# Patient Record
Sex: Male | Born: 1995
Health system: Southern US, Community
[De-identification: ages and names within clinical notes are randomized; demographics above are authoritative.]

## PROBLEM LIST (undated history)

## (undated) DIAGNOSIS — K219 Gastro-esophageal reflux disease without esophagitis: Secondary | ICD-10-CM

## (undated) HISTORY — PX: NO PAST SURGERIES: SHX2092

## (undated) HISTORY — DX: Gastro-esophageal reflux disease without esophagitis: K21.9

---

## 2020-05-24 ENCOUNTER — Emergency Department (HOSPITAL_COMMUNITY): Payer: Self-pay

## 2020-05-24 ENCOUNTER — Other Ambulatory Visit: Payer: Self-pay

## 2020-05-24 ENCOUNTER — Emergency Department (HOSPITAL_COMMUNITY)
Admission: EM | Admit: 2020-05-24 | Discharge: 2020-05-24 | Disposition: A | Discharge status: Home / Self Care | Payer: Self-pay | Attending: Emergency Medicine | Admitting: Emergency Medicine

## 2020-05-24 ENCOUNTER — Encounter (HOSPITAL_COMMUNITY): Payer: Self-pay | Admitting: Emergency Medicine

## 2020-05-24 DIAGNOSIS — Z87891 Personal history of nicotine dependence: Secondary | ICD-10-CM | POA: Insufficient documentation

## 2020-05-24 DIAGNOSIS — R0989 Other specified symptoms and signs involving the circulatory and respiratory systems: Secondary | ICD-10-CM

## 2020-05-24 DIAGNOSIS — F458 Other somatoform disorders: Secondary | ICD-10-CM | POA: Insufficient documentation

## 2020-05-24 DIAGNOSIS — R0602 Shortness of breath: Secondary | ICD-10-CM | POA: Insufficient documentation

## 2020-05-24 DIAGNOSIS — R09A2 Foreign body sensation, throat: Secondary | ICD-10-CM

## 2020-05-24 DIAGNOSIS — R0789 Other chest pain: Secondary | ICD-10-CM

## 2020-05-24 DIAGNOSIS — R198 Other specified symptoms and signs involving the digestive system and abdomen: Secondary | ICD-10-CM

## 2020-05-24 MED ORDER — FAMOTIDINE 20 MG PO TABS
40.0000 mg | ORAL_TABLET | Freq: Once | ORAL | Status: AC
  Administered 2020-05-24: 40 mg via ORAL
  Filled 2020-05-24: qty 2

## 2020-05-24 MED ORDER — FAMOTIDINE 20 MG PO TABS: 20.0000 mg | ORAL_TABLET | Freq: Two times a day (BID) | ORAL | 0 refills | Status: DC

## 2020-05-24 MED ORDER — ALUM & MAG HYDROXIDE-SIMETH 200-200-20 MG/5ML PO SUSP
30.0000 mL | Freq: Once | ORAL | Status: AC
  Administered 2020-05-24: 30 mL via ORAL
  Filled 2020-05-24: qty 30

## 2020-05-24 MED ORDER — LIDOCAINE VISCOUS HCL 2 % MT SOLN
15.0000 mL | Freq: Once | OROMUCOSAL | Status: AC
  Administered 2020-05-24: 15 mL via ORAL
  Filled 2020-05-24: qty 15

## 2020-05-24 MED ORDER — OMEPRAZOLE 20 MG PO CPDR: 20.0000 mg | DELAYED_RELEASE_CAPSULE | Freq: Two times a day (BID) | ORAL | 0 refills | Status: DC

## 2020-05-24 MED FILL — FAMOTIDINE 20 MG TABLET: 20 | 15 days supply | Qty: 30 | Fill #0

## 2020-05-24 MED FILL — OMEPRAZOLE 20 MG CAP: 20 | 30 days supply | Qty: 60 | Fill #0

## 2020-05-24 NOTE — ED Provider Notes (Signed)
[facility]-EMERGENCY DEPT Provider Note   CSN: [id number] Arrival date & time: 05/24/20  1055     History Chief Complaint  Patient presents with  . Chest Pain  . Abdominal Pain    Gary Murphy is a 24 y.o. male presents for evaluation of acute onset, progressively worsening dysphagia and globus sensation for 1 year.  He states that symptoms worsened this morning upon awakening so he came to the ED for evaluation.  He tells me that he has been experiencing difficulty swallowing and globus sensation but no pain with swallowing or drooling.  No voice change or fever.  No known sick contacts.  He will experience intermittent midline burning pain that radiates from the epigastric region up to the neck.  No aggravating or alleviating factors noted.  He states that he will feel short of breath "when I smell perfume".  He states that a year ago his PCP started him on omeprazole which he found to be helpful but did not continue taking.  Diet consistent of greasy foods but denies alcohol use or much acidic foods.  Denies abdominal pain, nausea, vomiting.  He is a former smoker, but would only smoke occasionally when he did smoke.  No significant family history of cardiac disease.  The history is provided by the patient.       History reviewed. No pertinent past medical history.  There are no problems to display for this patient.     History reviewed. No pertinent family history.  Social History   Tobacco Use  . Smoking status: Former Games developer  . Smokeless tobacco: Never Used  Substance Use Topics  . Alcohol use: Not on file  . Drug use: Not on file    Home Medications Prior to Admission medications   Medication Sig Start Date End Date Taking? Authorizing Provider  famotidine (PEPCID) 20 MG tablet Take 1 tablet (20 mg total) by mouth 2 (two) times daily. 05/24/20   Luevenia Maxin, Marlow Berenguer A, PA-C  omeprazole (PRILOSEC) 20 MG capsule Take 1 capsule (20 mg total) by mouth 2  (two) times daily before a meal. 05/24/20 06/23/20  Luevenia Maxin, Shamir Tuzzolino A, PA-C    Allergies    Patient has no known allergies.  Review of Systems   Review of Systems  Constitutional: Negative for chills and fever.  HENT: Positive for trouble swallowing. Negative for sore throat and voice change.        +globus sensation  Respiratory: Positive for shortness of breath.   Cardiovascular: Positive for chest pain.  Gastrointestinal: Negative for abdominal pain, nausea and vomiting.  All other systems reviewed and are negative.   Physical Exam Updated Vital Signs BP (!) 154/94 (BP Location: Left Arm)   Pulse 85   Temp 97.8 F (36.6 C) (Oral)   Resp 18   SpO2 100%   Physical Exam Vitals and nursing note reviewed.  Constitutional:      General: He is not in acute distress.    Appearance: He is well-developed.     Comments: Resting comfortably in bed  HENT:     Head: Normocephalic and atraumatic.     Mouth/Throat:     Mouth: Mucous membranes are moist.     Pharynx: No oropharyngeal exudate or posterior oropharyngeal erythema.     Comments: No tonsillar hypertrophy, exudates, erythema, uvular deviation, trismus or sublingual abnormalities.  Tolerating secretions without difficulty.  No abnormal phonation. Eyes:     General:        Right eye:  No discharge.        Left eye: No discharge.     Extraocular Movements: Extraocular movements intact.     Conjunctiva/sclera: Conjunctivae normal.  Neck:     Vascular: No JVD.     Trachea: No tracheal deviation.     Comments: No stridor Cardiovascular:     Rate and Rhythm: Normal rate and regular rhythm.     Pulses: Normal pulses.     Heart sounds: Normal heart sounds.     Comments: 2+ radial and DP/PT pulses bilaterally, Homans sign absent bilaterally, no lower extremity edema, no palpable cords, compartments are soft  Pulmonary:     Effort: Pulmonary effort is normal.  Abdominal:     General: There is no distension.  Musculoskeletal:          General: No swelling or tenderness.     Cervical back: Normal range of motion and neck supple. No rigidity or tenderness.  Skin:    General: Skin is warm and dry.     Findings: No erythema.  Neurological:     Mental Status: He is alert.  Psychiatric:        Behavior: Behavior normal.     ED Results / Procedures / Treatments   Labs (all labs ordered are listed, but only abnormal results are displayed) Labs Reviewed - No data to display  EKG EKG Interpretation  Date/Time:  Saturday May 24 2020 11:41:36 EDT Ventricular Rate:  73 PR Interval:    QRS Duration: 82 QT Interval:  370 QTC Calculation: 408 R Axis:   76 Text Interpretation: Sinus rhythm 12 Lead; Mason-Likar No STEMI Confirmed by Alvester Chou [phone]) on 05/24/2020 11:43:48 AM   Radiology DG Chest 2 View  Result Date: 05/24/2020 CLINICAL DATA:  Chest pain. EXAM: CHEST - 2 VIEW COMPARISON:  None. FINDINGS: The heart size and mediastinal contours are within normal limits. Both lungs are clear. The visualized skeletal structures are unremarkable. IMPRESSION: No active cardiopulmonary disease. Electronically Signed   By: Gerome Sam III M.D   On: 05/24/2020 11:56    Procedures Procedures (including critical care time)  Medications Ordered in ED Medications  alum & mag hydroxide-simeth (MAALOX/MYLANTA) 200-200-20 MG/5ML suspension 30 mL (30 mLs Oral Given 05/24/20 1152)    And  lidocaine (XYLOCAINE) 2 % viscous mouth solution 15 mL (15 mLs Oral Given 05/24/20 1152)  famotidine (PEPCID) tablet 40 mg (40 mg Oral Given 05/24/20 1152)    ED Course  I have reviewed the triage vital signs and the nursing notes.  Pertinent labs & imaging results that were available during my care of the patient were reviewed by me and considered in my medical decision making (see chart for details).    MDM Rules/Calculators/A&P                          Patient presenting for evaluation of globus sensation, intermittent  central chest pains radiating from the epigastric region up.  Symptoms have been present for 1 month but worsened upon awakening this morning's prompted his arrival to the ED.  Reports symptoms are similar to when he was treated for GERD successfully with omeprazole but he did not take this medication with regularity.  Currently does not have insurance or PCP with whom to follow-up.  He is afebrile, initially mildly hypertensive with resolution on subsequent reevaluation's.  Remainder of vital signs are within normal limits.  He is resting comfortably in no distress.  The  pain is not exertional, pleuritic, nor reproducible on palpation.  His EKG shows normal sinus rhythm with no acute ischemic abnormalities.  Chest x-ray shows no acute cardiopulmonary abnormalities including pneumonia, pleural effusion, cardiomegaly, or pneumothorax.  His symptoms entirely resolved with GI cocktail and Pepcid.  Symptoms suggestive of GERD.  He is overall low risk for cardiac disease and I doubt ACS/MI.  Doubt PE, cardiac tamponade, esophageal rupture, or dissection.  We discussed lifestyle and dietary modification and we will start him on a course of Pepcid and omeprazole.  We will follow up with [place] and wellness to salvage primary care services.  Discuss strict ED return precautions.  Patient verbalized understanding of and agreement with plan and is stable for discharge at this time.   Final Clinical Impression(s) / ED Diagnoses Final diagnoses:  Atypical chest pain  Globus sensation    Rx / DC Orders ED Discharge Orders         Ordered    omeprazole (PRILOSEC) 20 MG capsule  2 times daily before meals        05/24/20 1300    famotidine (PEPCID) 20 MG tablet  2 times daily        05/24/20 1300           Jeanie Sewer, PA-C 05/24/20 1347    Terald Sleeper, MD 05/24/20 1413

## 2020-05-24 NOTE — ED Triage Notes (Signed)
Patient states he has chest pain. He describes this pain as starting in the abdomin and radiating up to the chest. He has suffered from acid reflux and says this feels similar. He also complains of difficulty swallowing however, swallowing is not painful.

## 2020-05-24 NOTE — Discharge Instructions (Signed)
Start taking omeprazole and Pepcid twice daily.  Avoid spicy foods, fried foods, fatty foods, alcohol.  Avoid acidic foods.  Avoid eating too close to bedtime, I typically recommend around 2 hours between the time of your last meal and bedtime.  You can also elevate the head of the bed.  Follow-up with [place] and wellness for reevaluation.  Call and tell them you were referred from the emergency department.  Return to the emergency department if any concerning signs or symptoms develop such as fevers, persistent vomiting, difficulty breathing or swallowing, chest pain, shortness of breath or loss of consciousness.

## 2020-07-06 NOTE — Progress Notes (Signed)
07/06/2020 Gary Murphy [id number] 1996-01-04   CHIEF COMPLAINT: Difficulty swallowing  HISTORY OF PRESENT ILLNESS: Gary Murphy is a 24 year old male with a past medical history of GERD symptoms. No surgical history. He presents today accompanied by his cousin. He is originally from [place]. He speaks English well, however, his cousin briefly assists with interpretation during today's office consultation.  He presents today for further evaluation for dysphagia. He complains of having difficulty swallowing since 2019. Food gets stuck to the upper esophagus which lasts for less than 1 minute then passes down the esophagus slowly which initially started in 2019 and has progressively worsened over the past year. His dysphagia symptoms occur daily. Meat is typically problematic but at this point he has dysphagia with most foods. He is able to tolerate softer foods without significant dysphagia. He does not cough, gag or vomit out the stuck food. However, he has intermittent food regurgitation. He has heartburn a few days weekly. He has notice a change in his voice. He has chest and epigastric pain when he gets hungry.  He sleeps with 3 to 4 pillows to keep his head elevated at night time.  He underwent an un-sedated EGD in [place] in 2019. He stated the EGD was normal, no ulcers. He is passing a normal brown formed bowel movement daily. No rectal bleeding or melena. No family history of esophageal, gastric or colon cancer. He denies having a high stress level or anxiety. No fever, sweats or chills. No weight loss.  He presented to [facility] ED on 05/24/2020 with complaints of dysphagia with a globus sensation and chest pain.  He also noted having intermittent epigastric burning pain which radiated up into his neck.  A  chest x-ray and EKG were normal.  He received a GI cocktail and Pepcid and his symptoms resolved.  He was discharged home on Omeprazole 20 mg p.o. twice daily and Famotidine 20 mg  which he took for 20 days no improvement so he stopped taking these medications. No NSAID use.   He received Covid vaccination, 2nd 06/12/2020.   Social History: He smoked cigarettes < 1/2 5 years, stopped smoking 1 year ago.   Family History: Mother age 16. Father age 57 DM. 1 brother and 4 sisters. No family history  Medications: None   No Known Allergies    REVIEW OF SYSTEMS:  Gen: Denies fever, sweats or chills. No weight loss.  CV: See HPI. No palpitations or edema. Resp: Denies cough, shortness of breath of hemoptysis.  GI: See HPI. GU : Denies urinary burning, blood in urine, increased urinary frequency or incontinence. MS: Denies joint pain, muscles aches or weakness. Derm: Denies rash, itchiness, skin lesions or unhealing ulcers. Psych: Denies depression or anxiety. Heme: Denies bruising, bleeding. Neuro:  Denies headaches, dizziness or paresthesias. Endo:  Denies any problems with DM, thyroid or adrenal function.    PHYSICAL EXAM: BP 132/70 (BP Location: Left Arm, Patient Position: Sitting, Cuff Size: Normal)   Pulse 83   Ht 5\' 7"  (1.702 m)   Wt 151 lb 6 oz (68.7 kg)   SpO2 100%   BMI 23.71 kg/m   General: Well developed 24 year old male in no acute distress. Head: Normocephalic and atraumatic. Eyes:  Sclerae non-icteric, conjunctive pink. Ears: Normal auditory acuity. Mouth: Dentition intact. No ulcers or lesions.  Neck: Supple, no lymphadenopathy or thyromegaly.  Lungs: Clear bilaterally to auscultation without wheezes, crackles or rhonchi. Heart: Regular rate and rhythm. No murmur, rub or gallop  appreciated.  Abdomen: Soft, flat abdomen, non distended. Nontender. No masses. No hepatosplenomegaly. Normoactive bowel sounds x 4 quadrants.  Rectal: Deferred. Musculoskeletal: Symmetrical with no gross deformities. Skin: Warm and dry. No rash or lesions on visible extremities. Extremities: No edema. Neurological: Alert oriented x 4, no focal deficits.    Psychological:  Alert and cooperative. Normal mood and affect.  ASSESSMENT AND PLAN:  45.  24 year old male with GERD, dysphagia, chest pain and epigastric pain. Normal EKG and chest x-ray 05/2020. Patient reported having normal EGD in [place] in 2019. -GERD handout -Pantoprazole 20 mg 1 p.o. daily -EGD benefits and risks discussed including risk with sedation, risk of bleeding, perforation and infection  -CBC and CMP -Further follow-up to be determined after the above evaluation complete -Patient to call our office if his symptoms worsen -Discussed scheduling a barium swallow if the above evaluation negative      CC:  No ref. provider found

## 2020-07-07 ENCOUNTER — Encounter: Payer: Self-pay | Admitting: Nurse Practitioner

## 2020-07-07 ENCOUNTER — Telehealth: Payer: Self-pay | Admitting: General Surgery

## 2020-07-07 ENCOUNTER — Other Ambulatory Visit (INDEPENDENT_AMBULATORY_CARE_PROVIDER_SITE_OTHER): Payer: Self-pay

## 2020-07-07 ENCOUNTER — Ambulatory Visit (INDEPENDENT_AMBULATORY_CARE_PROVIDER_SITE_OTHER): Payer: Self-pay | Admitting: Nurse Practitioner

## 2020-07-07 VITALS — BP 132/70 | HR 83 | Ht 67.0 in | Wt 151.4 lb

## 2020-07-07 DIAGNOSIS — R1310 Dysphagia, unspecified: Secondary | ICD-10-CM

## 2020-07-07 DIAGNOSIS — K219 Gastro-esophageal reflux disease without esophagitis: Secondary | ICD-10-CM

## 2020-07-07 DIAGNOSIS — R1013 Epigastric pain: Secondary | ICD-10-CM

## 2020-07-07 LAB — CBC WITH DIFFERENTIAL/PLATELET
Basophils Absolute: 0 10*3/uL (ref 0.0–0.1)
Basophils Relative: 0.6 % (ref 0.0–3.0)
Eosinophils Absolute: 0 10*3/uL (ref 0.0–0.7)
Eosinophils Relative: 0.7 % (ref 0.0–5.0)
HCT: 41.6 % (ref 39.0–52.0)
Hemoglobin: 14 g/dL (ref 13.0–17.0)
Lymphocytes Relative: 36.3 % (ref 12.0–46.0)
Lymphs Abs: 2.1 10*3/uL (ref 0.7–4.0)
MCHC: 33.8 g/dL (ref 30.0–36.0)
MCV: 86.5 fl (ref 78.0–100.0)
Monocytes Absolute: 0.3 10*3/uL (ref 0.1–1.0)
Monocytes Relative: 5 % (ref 3.0–12.0)
Neutro Abs: 3.3 10*3/uL (ref 1.4–7.7)
Neutrophils Relative %: 57.4 % (ref 43.0–77.0)
Platelets: 153 10*3/uL (ref 150.0–400.0)
RBC: 4.81 Mil/uL (ref 4.22–5.81)
RDW: 12.9 % (ref 11.5–15.5)
WBC: 5.7 10*3/uL (ref 4.0–10.5)

## 2020-07-07 LAB — COMPREHENSIVE METABOLIC PANEL
ALT: 11 U/L (ref 0–53)
AST: 12 U/L (ref 0–37)
Albumin: 4.8 g/dL (ref 3.5–5.2)
Alkaline Phosphatase: 66 U/L (ref 39–117)
BUN: 13 mg/dL (ref 6–23)
CO2: 28 mEq/L (ref 19–32)
Calcium: 9.6 mg/dL (ref 8.4–10.5)
Chloride: 102 mEq/L (ref 96–112)
Creatinine, Ser: 1.07 mg/dL (ref 0.40–1.50)
GFR: 102.44 mL/min (ref >60.00)
Glucose, Bld: 94 mg/dL (ref 70–99)
Potassium: 3.9 mEq/L (ref 3.5–5.1)
Sodium: 139 mEq/L (ref 135–145)
Total Bilirubin: 0.9 mg/dL (ref 0.2–1.2)
Total Protein: 7.4 g/dL (ref 6.0–8.3)

## 2020-07-07 MED ORDER — PANTOPRAZOLE SODIUM 20 MG PO TBEC: 20.0000 mg | DELAYED_RELEASE_TABLET | Freq: Every day | ORAL | 1 refills | Status: DC

## 2020-07-07 NOTE — Telephone Encounter (Signed)
-----   Message from Arnaldo Natal, NP sent at 07/07/2020 12:19 PM EDT ----- French Ana, pls inform patient his labs were normal. Thx

## 2020-07-07 NOTE — Patient Instructions (Addendum)
If you are age 24 or older, your body mass index should be between 23-30. Your Body mass index is 23.71 kg/m. If this is out of the aforementioned range listed, please consider follow up with your Primary Care Provider.  If you are age 89 or younger, your body mass index should be between 19-25. Your Body mass index is 23.71 kg/m. If this is out of the aformentioned range listed, please consider follow up with your Primary Care Provider.   We have sent the following medications to your pharmacy for you to pick up at your convenience:  Pantoprazole 20mg  1 tablet daily  1. We have enclosed a pamphlet about GERD 2. Please cut your food into small pieces, chew food thoroughly. 3.call the office if your symptoms worsen.  Your provider has requested that you go to the basement level for lab work before leaving today. Press "B" on the elevator. The lab is located at the first door on the left as you exit the elevator.  Due to recent changes in healthcare laws, you may see the results of your imaging and laboratory studies on MyChart before your provider has had a chance to review them.  We understand that in some cases there may be results that are confusing or concerning to you. Not all laboratory results come back in the same time frame and the provider may be waiting for multiple results in order to interpret others.  Please give 48 hours in order for your provider to thoroughly review all the results before contacting the office for clarification of your results.   Thank you for choosing [facility], CRNP  [phone]

## 2020-07-14 NOTE — Progress Notes (Signed)
Reviewed and agree with documentation and assessment and plan. K. Veena Maggi Hershkowitz , MD   

## 2020-07-15 NOTE — Telephone Encounter (Signed)
No answer, cannot leave voicemail

## 2020-07-21 ENCOUNTER — Encounter: Payer: Self-pay | Admitting: General Surgery

## 2020-07-23 ENCOUNTER — Ambulatory Visit (AMBULATORY_SURGERY_CENTER): Payer: 59 | Admitting: Gastroenterology

## 2020-07-23 ENCOUNTER — Encounter: Payer: Self-pay | Admitting: Gastroenterology

## 2020-07-23 ENCOUNTER — Other Ambulatory Visit: Payer: Self-pay

## 2020-07-23 VITALS — BP 109/55 | HR 68 | Resp 19 | Ht 67.0 in | Wt 151.0 lb

## 2020-07-23 DIAGNOSIS — K2970 Gastritis, unspecified, without bleeding: Secondary | ICD-10-CM | POA: Diagnosis not present

## 2020-07-23 DIAGNOSIS — R1310 Dysphagia, unspecified: Secondary | ICD-10-CM | POA: Diagnosis not present

## 2020-07-23 DIAGNOSIS — B9681 Helicobacter pylori [H. pylori] as the cause of diseases classified elsewhere: Secondary | ICD-10-CM

## 2020-07-23 DIAGNOSIS — K3189 Other diseases of stomach and duodenum: Secondary | ICD-10-CM

## 2020-07-23 DIAGNOSIS — K219 Gastro-esophageal reflux disease without esophagitis: Secondary | ICD-10-CM

## 2020-07-23 DIAGNOSIS — K2100 Gastro-esophageal reflux disease with esophagitis, without bleeding: Secondary | ICD-10-CM | POA: Diagnosis not present

## 2020-07-23 DIAGNOSIS — K2990 Gastroduodenitis, unspecified, without bleeding: Secondary | ICD-10-CM

## 2020-07-23 HISTORY — PX: UPPER GASTROINTESTINAL ENDOSCOPY: SHX188

## 2020-07-23 MED ORDER — OMEPRAZOLE 40 MG PO CPDR: 40.0000 mg | DELAYED_RELEASE_CAPSULE | Freq: Every day | ORAL | 2 refills | Status: DC

## 2020-07-23 MED ORDER — SODIUM CHLORIDE 0.9 % IV SOLN: 500.0000 mL | Freq: Once | INTRAVENOUS | Status: DC

## 2020-07-23 NOTE — Progress Notes (Signed)
Called to room to assist during endoscopic procedure.  Patient ID and intended procedure confirmed with present staff. Received instructions for my participation in the procedure from the performing physician.  

## 2020-07-23 NOTE — Op Note (Signed)
[facility] Patient Name: Gary Murphy Procedure Date: 07/23/2020 7:47 AM MRN: [id number] Endoscopist: Napoleon FormKavitha V. Donnavin Vandenbrink , MD Age: 24 Referring MD:  Date of Birth: 02/04/1996 Gender: Male Account #: [id number] Procedure:                Upper GI endoscopy Indications:              Dysphagia Medicines:                Monitored Anesthesia Care Procedure:                Pre-Anesthesia Assessment:                           - Prior to the procedure, a History and Physical                            was performed, and patient medications and                            allergies were reviewed. The patient's tolerance of                            previous anesthesia was also reviewed. The risks                            and benefits of the procedure and the sedation                            options and risks were discussed with the patient.                            All questions were answered, and informed consent                            was obtained. Prior Anticoagulants: The patient has                            taken no previous anticoagulant or antiplatelet                            agents. ASA Grade Assessment: II - A patient with                            mild systemic disease. After reviewing the risks                            and benefits, the patient was deemed in                            satisfactory condition to undergo the procedure.                           After obtaining informed consent, the endoscope was  passed under direct vision. Throughout the                            procedure, the patient's blood pressure, pulse, and                            oxygen saturations were monitored continuously. The                            Endoscope was introduced through the mouth, and                            advanced to the second part of duodenum. The upper                            GI endoscopy was accomplished without  difficulty.                            The patient tolerated the procedure well. Scope In: Scope Out: Findings:                 The Z-line was regular and was found 40 cm from the                            incisors.                           LA Grade B (one or more mucosal breaks greater than                            5 mm, not extending between the tops of two mucosal                            folds) esophagitis was found 38 to 40 cm from the                            incisors. Biopsies were obtained from the proximal                            and distal esophagus with cold forceps for                            histology of suspected eosinophilic esophagitis.                           No endoscopic abnormality was evident in the                            esophagus to explain the patient's complaint of                            dysphagia. It was decided, however, to proceed with  dilation of the lower third of the esophagus. A TTS                            dilator was passed through the scope. Dilation with                            an 18-19-20 mm balloon dilator was performed to 20                            mm. The dilation site was examined following                            endoscope reinsertion and showed no change.                           The gastroesophageal flap valve was visualized                            endoscopically and classified as Hill Grade II                            (fold present, opens with respiration).                           Patchy mildly erythematous mucosa was found in the                            gastric antrum and in the prepyloric region of the                            stomach. Biopsies were taken with a cold forceps                            for Helicobacter pylori testing.                           The cardia and gastric fundus were normal on                            retroflexion.                            The examined duodenum was normal. Complications:            No immediate complications. Estimated Blood Loss:     Estimated blood loss was minimal. Impression:               - Z-line regular, 40 cm from the incisors.                           - LA Grade B reflux esophagitis. Biopsied.                           - No endoscopic esophageal abnormality to explain  patient's dysphagia. Esophagus dilated. Dilated.                           - Gastroesophageal flap valve classified as Hill                            Grade II (fold present, opens with respiration).                           - Erythematous mucosa in the antrum and prepyloric                            region of the stomach. Biopsied.                           - Normal examined duodenum. Recommendation:           - Patient has a contact number available for                            emergencies. The signs and symptoms of potential                            delayed complications were discussed with the                            patient. Return to normal activities tomorrow.                            Written discharge instructions were provided to the                            patient.                           - Resume previous diet.                           - Continue present medications.                           - Await pathology results.                           - Follow an antireflux regimen indefinitely.                           - Use Prilosec (omeprazole) 40 mg PO daily for 3                            months. Napoleon Form, MD 07/23/2020 9:01:49 AM This report has been signed electronically.

## 2020-07-23 NOTE — Progress Notes (Signed)
815 Robinul 0.1 mg IV given due large amount of secretions upon assessment.  MD made aware, vss

## 2020-07-23 NOTE — Progress Notes (Signed)
Report given to PACU, vss 

## 2020-07-23 NOTE — Progress Notes (Signed)
Pt's states no medical or surgical changes since previsit or office visit.   Vitals EW 

## 2020-07-23 NOTE — Patient Instructions (Signed)
Handouts provided on gastritis, esophagitis and stricture and GERD.   Start Prilosec (omeprazole) 40mg  by mouth daily for 3 months. Stop taking the Pantoprazole. Call my office for a follow up appointment if you are still having difficulties after taking the new medication for 3 months.   Follow antireflux regimen indefinitely- see GERD handout.    YOU HAD AN ENDOSCOPIC PROCEDURE TODAY AT THE [facility]:   Refer to the procedure report that was given to you for any specific questions about what was found during the examination.  If the procedure report does not answer your questions, please call your gastroenterologist to clarify.  If you requested that your care partner not be given the details of your procedure findings, then the procedure report has been included in a sealed envelope for you to review at your convenience later.  YOU SHOULD EXPECT: Some feelings of bloating in the abdomen. Passage of more gas than usual.  Walking can help get rid of the air that was put into your GI tract during the procedure and reduce the bloating. If you had a lower endoscopy (such as a colonoscopy or flexible sigmoidoscopy) you may notice spotting of blood in your stool or on the toilet paper. If you underwent a bowel prep for your procedure, you may not have a normal bowel movement for a few days.  Please Note:  You might notice some irritation and congestion in your nose or some drainage.  This is from the oxygen used during your procedure.  There is no need for concern and it should clear up in a day or so.  SYMPTOMS TO REPORT IMMEDIATELY:   Following upper endoscopy (EGD)  Vomiting of blood or coffee ground material  New chest pain or pain under the shoulder blades  Painful or persistently difficult swallowing  New shortness of breath  Fever of 100F or higher  Black, tarry-looking stools  For urgent or emergent issues, a gastroenterologist can be reached at any hour by calling [phone]  [phone]. Do not use MyChart messaging for urgent concerns.    DIET:  We do recommend a small meal at first, but then you may proceed to your regular diet.  Drink plenty of fluids but you should avoid alcoholic beverages for 24 hours.  ACTIVITY:  You should plan to take it easy for the rest of today and you should NOT DRIVE or use heavy machinery until tomorrow (because of the sedation medicines used during the test).    FOLLOW UP: Our staff will call the number listed on your records 48-72 hours following your procedure to check on you and address any questions or concerns that you may have regarding the information given to you following your procedure. If we do not reach you, we will leave a message.  We will attempt to reach you two times.  During this call, we will ask if you have developed any symptoms of COVID 19. If you develop any symptoms (ie: fever, flu-like symptoms, shortness of breath, cough etc.) before then, please call [phone].  If you test positive for Covid 19 in the 2 weeks post procedure, please call and report this information to [phone].    If any biopsies were taken you will be contacted by phone or by letter within the next 1-3 weeks.  Please call us at [phone] if you have not heard about the biopsies in 3 weeks.    SIGNATURES/CONFIDENTIALITY: You and/or your care partner have signed paperwork which will be  entered into your electronic medical record.  These signatures attest to the fact that that the information above on your After Visit Summary has been reviewed and is understood.  Full responsibility of the confidentiality of this discharge information lies with you and/or your care-partner.

## 2020-07-25 ENCOUNTER — Telehealth: Payer: Self-pay

## 2020-07-25 ENCOUNTER — Telehealth: Payer: Self-pay | Admitting: *Deleted

## 2020-07-25 NOTE — Telephone Encounter (Signed)
First attempt, left VM.  

## 2020-07-25 NOTE — Telephone Encounter (Signed)
Left message on answering machine. 

## 2020-08-12 ENCOUNTER — Encounter: Payer: Self-pay | Admitting: Gastroenterology

## 2020-08-12 ENCOUNTER — Telehealth: Payer: Self-pay | Admitting: Gastroenterology

## 2020-08-12 ENCOUNTER — Other Ambulatory Visit: Payer: Self-pay

## 2020-08-12 ENCOUNTER — Telehealth: Payer: Self-pay

## 2020-08-12 DIAGNOSIS — K2970 Gastritis, unspecified, without bleeding: Secondary | ICD-10-CM

## 2020-08-12 DIAGNOSIS — K219 Gastro-esophageal reflux disease without esophagitis: Secondary | ICD-10-CM

## 2020-08-12 NOTE — Telephone Encounter (Signed)
Positive H pylori Needs Pylera PA started through [employer] [place]: [id number] - PA Case ID: [id number]

## 2020-08-12 NOTE — Telephone Encounter (Signed)
Advised of his positive H Pylori test.

## 2020-08-13 NOTE — Telephone Encounter (Signed)
Explained to the patient. 

## 2020-08-13 NOTE — Telephone Encounter (Signed)
He doesn't have eosinophilic esophagitis but has lymphocytic esophagitis, it is unclear what triggers lymphocytic esophagitis, uncontrolled GERD is one of the triggers, hence plan to treat with high dose PPI twice daily and follow up in office. Thanks

## 2020-08-13 NOTE — Telephone Encounter (Signed)
Called the patient back. Line rang several times then turned to a busy signal. No voicemail.

## 2020-08-13 NOTE — Telephone Encounter (Signed)
Patient called has additional questions in reference to the EGD results

## 2020-08-13 NOTE — Telephone Encounter (Signed)
Spoke with the patient. He wants a copy of the pathology report mailed to him. Address confirmed. He asks about eosinophilic esophagitis. He was expecting that he does have this. Please advise. Thanks

## 2020-08-14 ENCOUNTER — Other Ambulatory Visit: Payer: Self-pay

## 2020-08-14 DIAGNOSIS — K219 Gastro-esophageal reflux disease without esophagitis: Secondary | ICD-10-CM

## 2020-08-14 DIAGNOSIS — K2970 Gastritis, unspecified, without bleeding: Secondary | ICD-10-CM

## 2020-08-14 MED ORDER — PYLERA 140-125-125 MG PO CAPS: 3.0000 | ORAL_CAPSULE | Freq: Three times a day (TID) | ORAL | 0 refills | Status: DC

## 2020-08-14 MED ORDER — OMEPRAZOLE 40 MG PO CPDR: 40.0000 mg | DELAYED_RELEASE_CAPSULE | Freq: Two times a day (BID) | ORAL | 2 refills | Status: DC

## 2020-08-15 ENCOUNTER — Other Ambulatory Visit: Payer: Self-pay

## 2020-08-15 MED ORDER — TALICIA 250-12.5-10 MG PO CPDR: 4.0000 | DELAYED_RELEASE_CAPSULE | Freq: Three times a day (TID) | ORAL | 0 refills | Status: AC

## 2020-08-15 MED ORDER — TALICIA 250-12.5-10 MG PO CPDR: 4.0000 | DELAYED_RELEASE_CAPSULE | Freq: Three times a day (TID) | ORAL | 0 refills | Status: DC

## 2020-08-15 NOTE — Telephone Encounter (Signed)
Insurance has denied payment for Pylera. Kathaleen Bury Rx submitted to [facility]. Medical records faxed for the pharmacy team for prior authorization. I have left the patient a message to explain this.

## 2020-08-18 ENCOUNTER — Telehealth: Payer: Self-pay | Admitting: Gastroenterology

## 2020-08-18 NOTE — Telephone Encounter (Signed)
Called the pharmacy and cancelled the prescription.

## 2020-08-18 NOTE — Telephone Encounter (Signed)
Will [facility] give me an update on this medication? Thanks

## 2020-08-18 NOTE — Telephone Encounter (Signed)
They should - I will watch for something.  Also that is who is doing our lunch today so we can ask him to check on it.

## 2020-08-20 ENCOUNTER — Other Ambulatory Visit: Payer: Self-pay

## 2020-08-20 DIAGNOSIS — K2970 Gastritis, unspecified, without bleeding: Secondary | ICD-10-CM

## 2020-08-20 DIAGNOSIS — K219 Gastro-esophageal reflux disease without esophagitis: Secondary | ICD-10-CM

## 2020-08-20 MED ORDER — OMEPRAZOLE 40 MG PO CPDR: 40.0000 mg | DELAYED_RELEASE_CAPSULE | Freq: Two times a day (BID) | ORAL | 0 refills | Status: DC

## 2020-08-20 NOTE — Telephone Encounter (Signed)
Pt is requesting a call back from a nurse to discuss the medication omeprazole, pt would like to know if he should continue taking this medication after the 2 weeks he gets done with the TALICIA.

## 2020-08-20 NOTE — Telephone Encounter (Signed)
Still pending.  I will check with the rep

## 2020-08-20 NOTE — Telephone Encounter (Signed)
Is there anything yet?

## 2020-08-20 NOTE — Telephone Encounter (Signed)
Dr Lavon Paganini The patient had already picked up the Omeprazole when I called in the Pylera. Insurance denied it. We got him Talicia for $35 but it has the Omeprazole in it. Do you want him to continue to take Omeprazole after the Talicia, following the usual protocol for follow up testing?

## 2020-08-20 NOTE — Telephone Encounter (Signed)
Yes it is fine to use Talicia, please advise him to take omeprazole 40 mg twice daily along with that as the medication pack has very low-dose omeprazole.  We will check H. pylori stool antigen 2 to 3 weeks weeks after completing therapy off PPI.  Thank you

## 2020-08-22 NOTE — Telephone Encounter (Signed)
Gary Murphy approved for $35.  Company is reaching out to patient so it can be shipped

## 2020-10-10 ENCOUNTER — Encounter: Payer: Self-pay | Admitting: Gastroenterology

## 2020-10-10 ENCOUNTER — Ambulatory Visit (INDEPENDENT_AMBULATORY_CARE_PROVIDER_SITE_OTHER): Payer: 59 | Admitting: Gastroenterology

## 2020-10-10 VITALS — BP 128/66 | HR 72 | Ht 70.0 in | Wt 159.0 lb

## 2020-10-10 DIAGNOSIS — K219 Gastro-esophageal reflux disease without esophagitis: Secondary | ICD-10-CM

## 2020-10-10 DIAGNOSIS — K2970 Gastritis, unspecified, without bleeding: Secondary | ICD-10-CM | POA: Diagnosis not present

## 2020-10-10 DIAGNOSIS — B9681 Helicobacter pylori [H. pylori] as the cause of diseases classified elsewhere: Secondary | ICD-10-CM

## 2020-10-10 DIAGNOSIS — R1310 Dysphagia, unspecified: Secondary | ICD-10-CM | POA: Diagnosis not present

## 2020-10-10 DIAGNOSIS — K52839 Microscopic colitis, unspecified: Secondary | ICD-10-CM

## 2020-10-10 MED ORDER — OMEPRAZOLE 40 MG PO CPDR: 40.0000 mg | DELAYED_RELEASE_CAPSULE | Freq: Two times a day (BID) | ORAL | 3 refills | Status: AC

## 2020-10-10 NOTE — Progress Notes (Signed)
Gary Murphy    [id number]    [birth year]  Primary Care Physician:Patient, No Pcp Per  Referring Physician: No referring provider defined for this encounter.   Chief complaint:  H.pylori  HPI:  25 year old very pleasant male is here for follow-up visit for H. pylori gastritis, dyspepsia and dysphagia  He completed treatment for H. Pylori, 2-3 weeks ago, is feeling better but still has dyspepsia symptoms  He continues to have dysphagia especially with meat though has noticed significant improvement with PPI  EGD July 23, 2020: Regular Z-line.  LA grade B reflux esophagitis, biopsies showed increased intraepithelial lymphocytes.  S/p esophageal dilation with TTS balloon to 20 mm.  Gastritis, biopsies positive for H. pylori infection  Outpatient Encounter Medications as of 10/10/2020  Medication Sig   omeprazole (PRILOSEC) 40 MG capsule Take 1 capsule (40 mg total) by mouth 2 (two) times daily before a meal for 14 days. (Patient taking differently: Take 40 mg by mouth in the morning and at bedtime.)   No facility-administered encounter medications on file as of 10/10/2020.    Allergies as of 10/10/2020   (No Known Allergies)    Past Medical History:  Diagnosis Date   GERD (gastroesophageal reflux disease)     Past Surgical History:  Procedure Laterality Date   NO PAST SURGERIES     UPPER GASTROINTESTINAL ENDOSCOPY  07/23/2020    Family History  Problem Relation Age of Onset   Diabetes Father    Colon cancer Neg Hx    Esophageal cancer Neg Hx    Pancreatic cancer Neg Hx    Stomach cancer Neg Hx     Social History   Socioeconomic History   Marital status: Single    Spouse name: Not on file   Number of children: Not on file   Years of education: Not on file   Highest education level: Not on file  Occupational History   Not on file  Tobacco Use   Smoking status: Former Smoker    Types: Cigarettes   Smokeless tobacco: Never Used   Building services engineer Use: Never used  Substance and Sexual Activity   Alcohol use: Never   Drug use: Never   Sexual activity: Not on file  Other Topics Concern   Not on file  Social History Narrative   Not on file   Social Determinants of Health   Financial Resource Strain: Not on file  Food Insecurity: Not on file  Transportation Needs: Not on file  Physical Activity: Not on file  Stress: Not on file  Social Connections: Not on file  Intimate Partner Violence: Not on file      Review of systems: All other review of systems negative except as mentioned in the HPI.   Physical Exam: Vitals:   10/10/20 1316  BP: 128/66  Pulse: 72   Body mass index is 22.81 kg/m. Gen:      No acute distress HEENT:  sclera anicteric Abd:      soft, non-tender; no palpable masses, no distension Ext:    No edema Neuro: alert and oriented x 3 Psych: normal mood and affect  Data Reviewed:  Reviewed labs, radiology imaging, old records and pertinent past GI work up   Assessment and Plan/Recommendations:  25 year old very pleasant male with history of lymphocytic esophagitis and H. pylori gastritis with complaints of persistent solid dysphagia and dyspepsia  Plan to proceed with EGD with repeat esophageal biopsies  and esophageal dilation if needed We will also obtain gastric biopsies to confirm eradication of H. Pylori Continue PPI and antireflux measures  The risks and benefits as well as alternatives of endoscopic procedure(s) have been discussed and reviewed. All questions answered. The patient agrees to proceed.   The patient was provided an opportunity to ask questions and all were answered. The patient agreed with the plan and demonstrated an understanding of the instructions.  Iona Beard , MD    CC: No ref. provider found

## 2020-10-10 NOTE — Patient Instructions (Signed)
You have been scheduled for an endoscopy. Please follow written instructions given to you at your visit today. If you use inhalers (even only as needed), please bring them with you on the day of your procedure.   Due to recent changes in healthcare laws, you may see the results of your imaging and laboratory studies on MyChart before your provider has had a chance to review them.  We understand that in some cases there may be results that are confusing or concerning to you. Not all laboratory results come back in the same time frame and the provider may be waiting for multiple results in order to interpret others.  Please give [place] 48 hours in order for your provider to thoroughly review all the results before contacting the office for clarification of your results.   We have sent Omeprazole to your pharmacy  Follow up in 3 months  I appreciate the  opportunity to care for you  Thank You   Marsa Aris , MD

## 2020-10-24 ENCOUNTER — Encounter: Payer: Self-pay | Admitting: Gastroenterology

## 2020-11-26 ENCOUNTER — Telehealth: Payer: Self-pay | Admitting: Gastroenterology

## 2020-11-26 NOTE — Telephone Encounter (Signed)
Pt is requesting a call back from a nurse to discuss some questions he has about his procedure scheduled for tomorrow.

## 2020-11-26 NOTE — Telephone Encounter (Signed)
Attempted to call the patient back. Reached his brother and he will give him our direct number to call back with questions.

## 2020-11-27 ENCOUNTER — Encounter: Payer: Self-pay | Admitting: Gastroenterology

## 2020-11-27 ENCOUNTER — Other Ambulatory Visit: Payer: Self-pay

## 2020-11-27 ENCOUNTER — Ambulatory Visit (AMBULATORY_SURGERY_CENTER): Payer: 59 | Admitting: Gastroenterology

## 2020-11-27 VITALS — BP 92/42 | HR 70 | Temp 98.4°F | Resp 18 | Ht 70.0 in | Wt 159.0 lb

## 2020-11-27 DIAGNOSIS — R1319 Other dysphagia: Secondary | ICD-10-CM | POA: Diagnosis present

## 2020-11-27 DIAGNOSIS — K2970 Gastritis, unspecified, without bleeding: Secondary | ICD-10-CM | POA: Diagnosis not present

## 2020-11-27 DIAGNOSIS — K219 Gastro-esophageal reflux disease without esophagitis: Secondary | ICD-10-CM

## 2020-11-27 DIAGNOSIS — B9681 Helicobacter pylori [H. pylori] as the cause of diseases classified elsewhere: Secondary | ICD-10-CM

## 2020-11-27 MED ORDER — SODIUM CHLORIDE 0.9 % IV SOLN: 500.0000 mL | Freq: Once | INTRAVENOUS | Status: DC

## 2020-11-27 NOTE — Patient Instructions (Signed)
YOU HAD AN ENDOSCOPIC PROCEDURE TODAY AT THE [facility]:   Refer to the procedure report that was given to you for any specific questions about what was found during the examination.  If the procedure report does not answer your questions, please call your gastroenterologist to clarify.  If you requested that your care partner not be given the details of your procedure findings, then the procedure report has been included in a sealed envelope for you to review at your convenience later.  YOU SHOULD EXPECT: Some feelings of bloating in the abdomen. Passage of more gas than usual.  Walking can help get rid of the air that was put into your GI tract during the procedure and reduce the bloating. If you had a lower endoscopy (such as a colonoscopy or flexible sigmoidoscopy) you may notice spotting of blood in your stool or on the toilet paper. If you underwent a bowel prep for your procedure, you may not have a normal bowel movement for a few days.  Please Note:  You might notice some irritation and congestion in your nose or some drainage.  This is from the oxygen used during your procedure.  There is no need for concern and it should clear up in a day or so.  SYMPTOMS TO REPORT IMMEDIATELY:    Following upper endoscopy (EGD)  Vomiting of blood or coffee ground material  New chest pain or pain under the shoulder blades  Painful or persistently difficult swallowing  New shortness of breath  Fever of 100F or higher  Black, tarry-looking stools  For urgent or emergent issues, a gastroenterologist can be reached at any hour by calling [phone]. Do not use MyChart messaging for urgent concerns.    DIET:  We do recommend a small meal at first, but then you may proceed to your regular diet.  Drink plenty of fluids but you should avoid alcoholic beverages for 24 hours.  ACTIVITY:  You should plan to take it easy for the rest of today and you should NOT DRIVE or use heavy machinery  until tomorrow (because of the sedation medicines used during the test).    FOLLOW UP: Our staff will call the number listed on your records 48-72 hours following your procedure to check on you and address any questions or concerns that you may have regarding the information given to you following your procedure. If we do not reach you, we will leave a message.  We will attempt to reach you two times.  During this call, we will ask if you have developed any symptoms of COVID 19. If you develop any symptoms (ie: fever, flu-like symptoms, shortness of breath, cough etc.) before then, please call [phone].  If you test positive for Covid 19 in the 2 weeks post procedure, please call and report this information to us.    If any biopsies were taken you will be contacted by phone or by letter within the next 1-3 weeks.  Please call us at [phone] if you have not heard about the biopsies in 3 weeks.    SIGNATURES/CONFIDENTIALITY: You and/or your care partner have signed paperwork which will be entered into your electronic medical record.  These signatures attest to the fact that that the information above on your After Visit Summary has been reviewed and is understood.  Full responsibility of the confidentiality of this discharge information lies with you and/or your care-partner. 

## 2020-11-27 NOTE — Op Note (Signed)
[facility] Patient Name: Gary Murphy Procedure Date: 11/27/2020 10:27 AM MRN: [id number] Endoscopist: Napoleon Form , MD Age: 25 Referring MD:  Date of Birth: 11/17/1995 Gender: Male Account #: [id number] Procedure:                Upper GI endoscopy Indications:              Dysphagia, Esophageal reflux symptoms that persist                            despite appropriate therapy, lymphocytic                            Esophagitis and H.pylori gastritis Medicines:                Monitored Anesthesia Care Procedure:                Pre-Anesthesia Assessment:                           - Prior to the procedure, a History and Physical                            was performed, and patient medications and                            allergies were reviewed. The patient's tolerance of                            previous anesthesia was also reviewed. The risks                            and benefits of the procedure and the sedation                            options and risks were discussed with the patient.                            All questions were answered, and informed consent                            was obtained. Prior Anticoagulants: The patient has                            taken no previous anticoagulant or antiplatelet                            agents. ASA Grade Assessment: II - A patient with                            mild systemic disease. After reviewing the risks                            and benefits, the patient was deemed in  satisfactory condition to undergo the procedure.                           After obtaining informed consent, the endoscope was                            passed under direct vision. Throughout the                            procedure, the patient's blood pressure, pulse, and                            oxygen saturations were monitored continuously. The                            Endoscope was introduced  through the mouth, and                            advanced to the second part of duodenum. The upper                            GI endoscopy was accomplished without difficulty.                            The patient tolerated the procedure well. Scope In: Scope Out: Findings:                 The Z-line was regular and was found 38 cm from the                            incisors.                           The gastroesophageal flap valve was visualized                            endoscopically and classified as Hill Grade II                            (fold present, opens with respiration).                           No endoscopic abnormality was evident in the                            esophagus to explain the patient's complaint of                            dysphagia. It was decided, however, to proceed with                            dilation of the entire esophagus. The scope was                            withdrawn. Dilation  was performed with a Maloney                            dilator with no resistance at 54 Fr. The dilation                            site was examined following endoscope reinsertion                            and showed no change. Biopsies were obtained from                            the proximal and distal esophagus with cold forceps                            for histology of suspected eosinophilic esophagitis.                           The entire examined stomach was normal. Biopsies                            were taken with a cold forceps for Helicobacter                            pylori testing.                           The cardia and gastric fundus were normal on                            retroflexion.                           The examined duodenum was normal. Complications:            No immediate complications. Estimated Blood Loss:     Estimated blood loss was minimal. Impression:               - Z-line regular, 38 cm from the incisors.                            - Gastroesophageal flap valve classified as Hill                            Grade II (fold present, opens with respiration).                           - No endoscopic esophageal abnormality to explain                            patient's dysphagia. Esophagus dilated. Dilated.                            Biopsied.                           -  Normal stomach. Biopsied.                           - Normal examined duodenum. Recommendation:           - Patient has a contact number available for                            emergencies. The signs and symptoms of potential                            delayed complications were discussed with the                            patient. Return to normal activities tomorrow.                            Written discharge instructions were provided to the                            patient.                           - Resume previous diet.                           - Continue present medications.                           - Await pathology results.                           - Follow an antireflux regimen indefinitely.                           - Use Prilosec (omeprazole) 40 mg PO BID.                           - Return to GI office in 3 months. Please call to                            schedule appointment. Napoleon Form, MD 11/27/2020 10:51:34 AM This report has been signed electronically.

## 2020-11-27 NOTE — Progress Notes (Signed)
Called to room to assist during endoscopic procedure.  Patient ID and intended procedure confirmed with present staff. Received instructions for my participation in the procedure from the performing physician.  

## 2020-11-27 NOTE — Progress Notes (Signed)
VS taken by C.W. 

## 2020-11-27 NOTE — Progress Notes (Signed)
pt tolerated well. VSS. awake and to recovery. Report given to RN. Bite block inserted while awake. Pt confirmed comfort. Bite block left insitu to recovery.

## 2020-12-01 ENCOUNTER — Telehealth: Payer: Self-pay

## 2020-12-01 ENCOUNTER — Telehealth: Payer: Self-pay | Admitting: *Deleted

## 2020-12-01 NOTE — Telephone Encounter (Signed)
  Follow up Call-  Call back number 11/27/2020 07/23/2020  Post procedure Call Back phone  # [phone]  Permission to leave phone message Yes Yes     Called and someone else answered and them hung up. Will try later.

## 2020-12-01 NOTE — Telephone Encounter (Signed)
Second attempt, left VM.  

## 2020-12-09 ENCOUNTER — Encounter: Payer: Self-pay | Admitting: Gastroenterology

## 2021-07-09 IMAGING — CR DG CHEST 2V
2 series · 2 of 2 positions shown · non-contrast
Comparison: None.

CLINICAL DATA: Chest pain.

EXAM:
CHEST - 2 VIEW

[w chest pa]
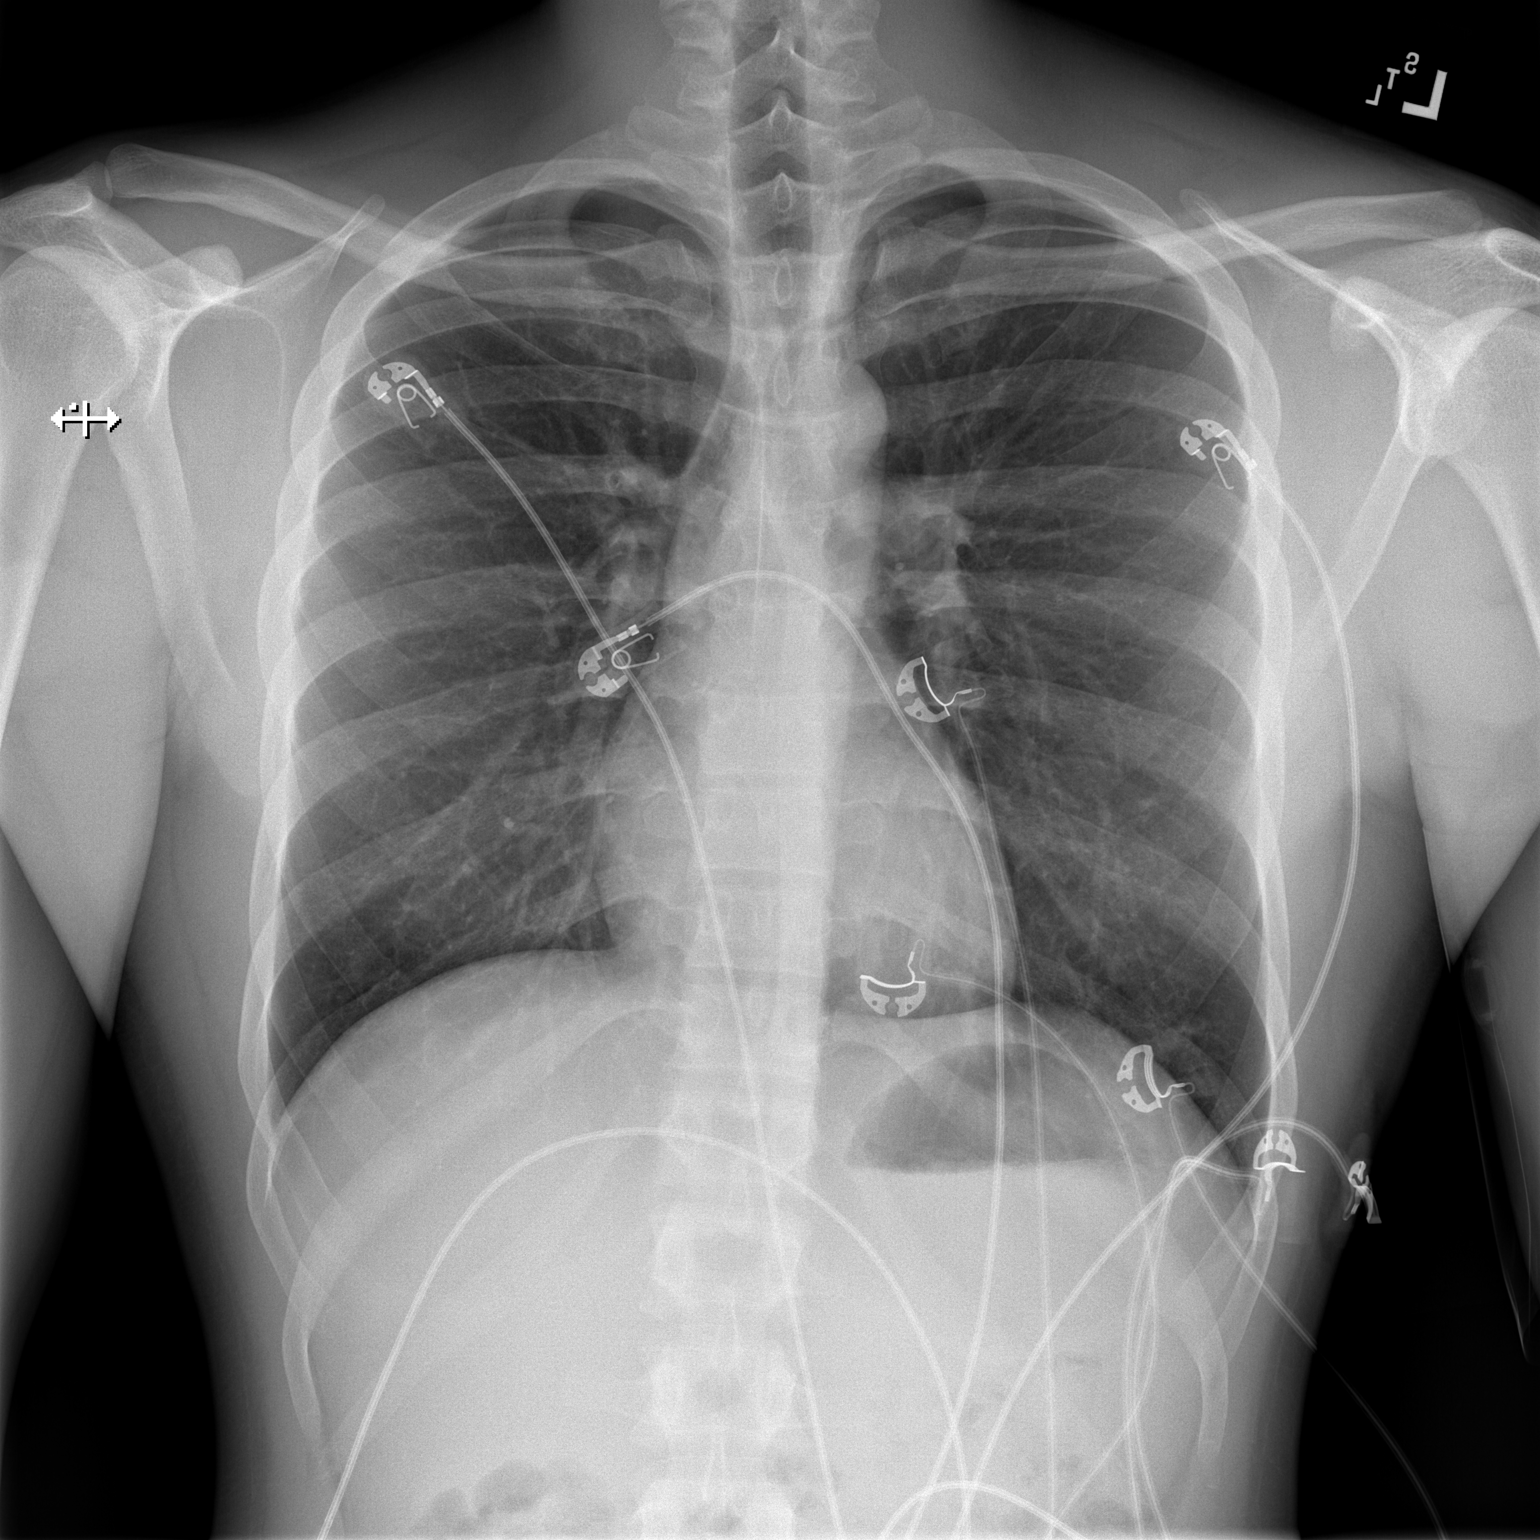

[w chest lat]
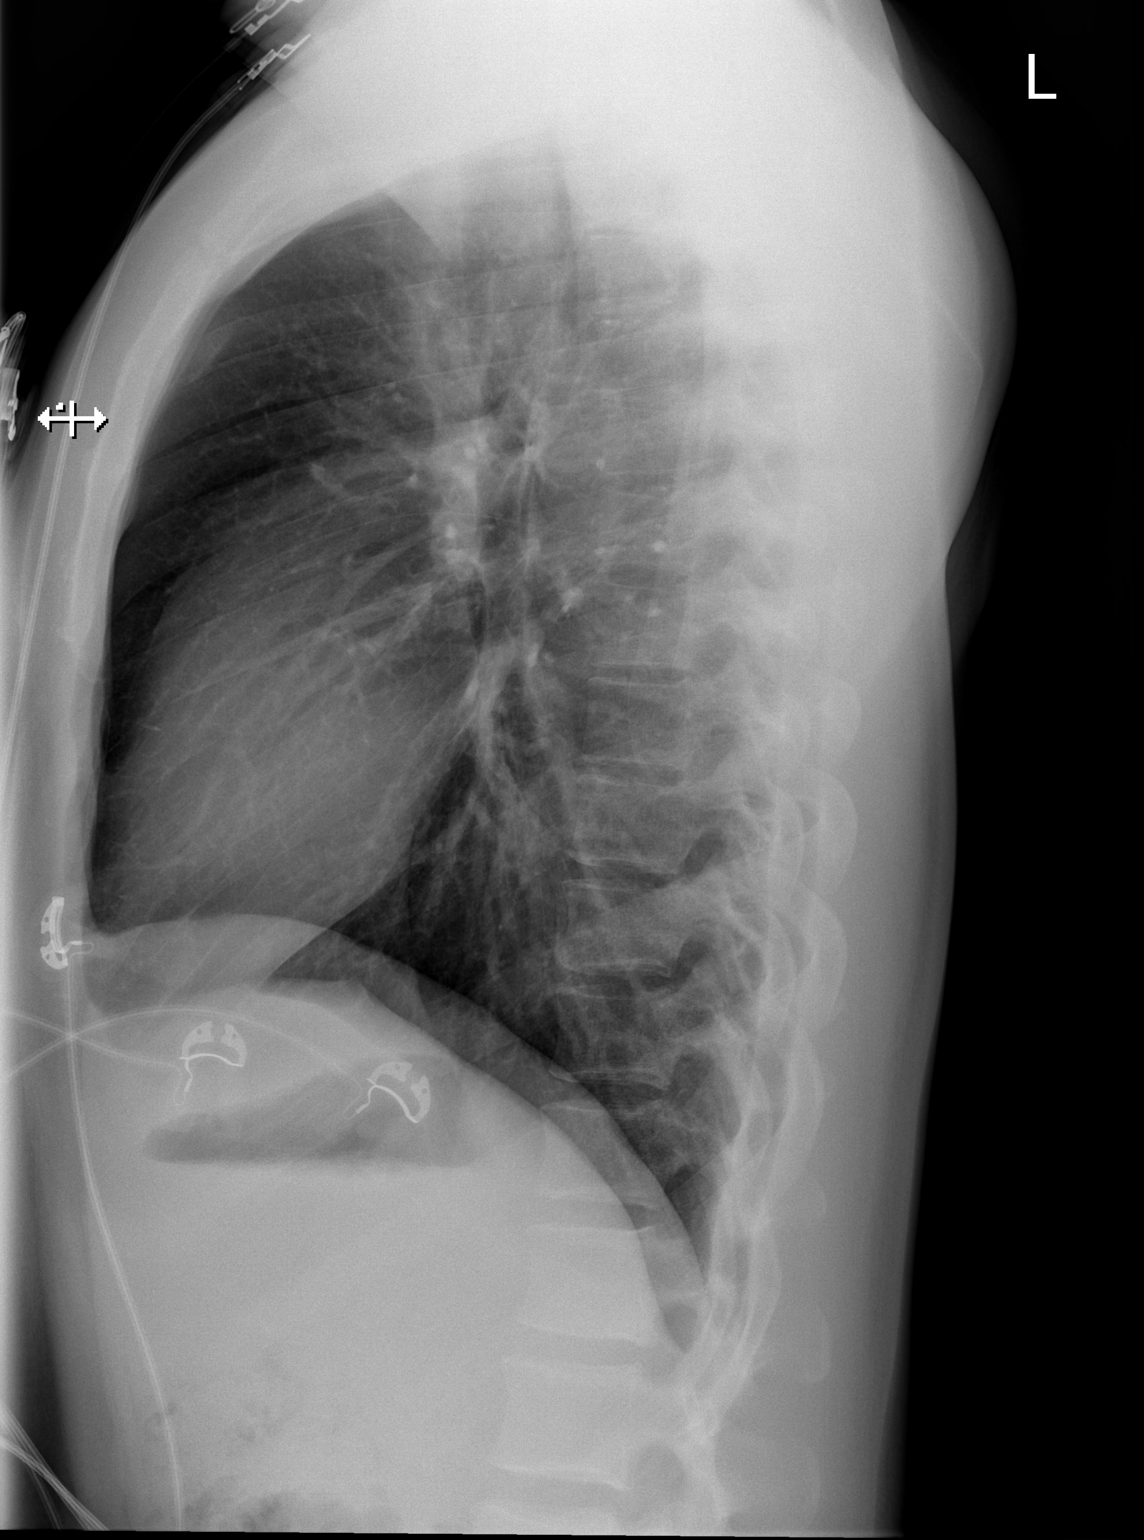

[2 of 2 positions shown; findings below may reference images not displayed]

FINDINGS: The heart size and mediastinal contours are within normal limits.
Both lungs are clear. The visualized skeletal structures are
unremarkable.
IMPRESSION: No active cardiopulmonary disease.
